# Patient Record
Sex: Female | Born: 1950 | Race: White | Hispanic: No | Marital: Single | Smoking: Current every day smoker
Health system: Southern US, Community
[De-identification: ages and names within clinical notes are randomized; demographics above are authoritative.]

## PROBLEM LIST (undated history)

## (undated) DIAGNOSIS — E119 Type 2 diabetes mellitus without complications: Secondary | ICD-10-CM

## (undated) DIAGNOSIS — F329 Major depressive disorder, single episode, unspecified: Secondary | ICD-10-CM

## (undated) DIAGNOSIS — M797 Fibromyalgia: Secondary | ICD-10-CM

## (undated) DIAGNOSIS — K529 Noninfective gastroenteritis and colitis, unspecified: Secondary | ICD-10-CM

## (undated) DIAGNOSIS — M109 Gout, unspecified: Secondary | ICD-10-CM

## (undated) DIAGNOSIS — I10 Essential (primary) hypertension: Secondary | ICD-10-CM

## (undated) DIAGNOSIS — F32A Depression, unspecified: Secondary | ICD-10-CM

## (undated) HISTORY — PX: ABDOMINAL HYSTERECTOMY: SHX81

## (undated) HISTORY — PX: APPENDECTOMY: SHX54

## (undated) HISTORY — PX: HAND SURGERY: SHX662

## (undated) HISTORY — PX: CHOLECYSTECTOMY: SHX55

## (undated) HISTORY — PX: OTHER SURGICAL HISTORY: SHX169

## (undated) HISTORY — PX: THYROIDECTOMY: SHX17

---

## 2015-05-23 ENCOUNTER — Emergency Department (HOSPITAL_BASED_OUTPATIENT_CLINIC_OR_DEPARTMENT_OTHER): Payer: Medicare Other

## 2015-05-23 ENCOUNTER — Emergency Department (HOSPITAL_BASED_OUTPATIENT_CLINIC_OR_DEPARTMENT_OTHER)
Admission: EM | Admit: 2015-05-23 | Discharge: 2015-05-23 | Disposition: A | Discharge status: Home / Self Care | Payer: Medicare Other | Attending: Emergency Medicine | Admitting: Emergency Medicine

## 2015-05-23 ENCOUNTER — Encounter (HOSPITAL_BASED_OUTPATIENT_CLINIC_OR_DEPARTMENT_OTHER): Payer: Self-pay | Admitting: Emergency Medicine

## 2015-05-23 DIAGNOSIS — Y92003 Bedroom of unspecified non-institutional (private) residence as the place of occurrence of the external cause: Secondary | ICD-10-CM | POA: Diagnosis not present

## 2015-05-23 DIAGNOSIS — S6992XA Unspecified injury of left wrist, hand and finger(s), initial encounter: Secondary | ICD-10-CM | POA: Diagnosis present

## 2015-05-23 DIAGNOSIS — W010XXA Fall on same level from slipping, tripping and stumbling without subsequent striking against object, initial encounter: Secondary | ICD-10-CM | POA: Diagnosis not present

## 2015-05-23 DIAGNOSIS — E119 Type 2 diabetes mellitus without complications: Secondary | ICD-10-CM | POA: Diagnosis not present

## 2015-05-23 DIAGNOSIS — Z8719 Personal history of other diseases of the digestive system: Secondary | ICD-10-CM | POA: Insufficient documentation

## 2015-05-23 DIAGNOSIS — Y9301 Activity, walking, marching and hiking: Secondary | ICD-10-CM | POA: Diagnosis not present

## 2015-05-23 DIAGNOSIS — Z8739 Personal history of other diseases of the musculoskeletal system and connective tissue: Secondary | ICD-10-CM | POA: Insufficient documentation

## 2015-05-23 DIAGNOSIS — S60212A Contusion of left wrist, initial encounter: Secondary | ICD-10-CM | POA: Diagnosis not present

## 2015-05-23 DIAGNOSIS — I10 Essential (primary) hypertension: Secondary | ICD-10-CM | POA: Insufficient documentation

## 2015-05-23 DIAGNOSIS — S52572A Other intraarticular fracture of lower end of left radius, initial encounter for closed fracture: Secondary | ICD-10-CM | POA: Diagnosis not present

## 2015-05-23 DIAGNOSIS — Y998 Other external cause status: Secondary | ICD-10-CM | POA: Insufficient documentation

## 2015-05-23 DIAGNOSIS — Z8659 Personal history of other mental and behavioral disorders: Secondary | ICD-10-CM | POA: Diagnosis not present

## 2015-05-23 DIAGNOSIS — Z720 Tobacco use: Secondary | ICD-10-CM | POA: Diagnosis not present

## 2015-05-23 DIAGNOSIS — S52502A Unspecified fracture of the lower end of left radius, initial encounter for closed fracture: Secondary | ICD-10-CM

## 2015-05-23 DIAGNOSIS — R52 Pain, unspecified: Secondary | ICD-10-CM

## 2015-05-23 HISTORY — DX: Gout, unspecified: M10.9

## 2015-05-23 HISTORY — DX: Depression, unspecified: F32.A

## 2015-05-23 HISTORY — DX: Major depressive disorder, single episode, unspecified: F32.9

## 2015-05-23 HISTORY — DX: Noninfective gastroenteritis and colitis, unspecified: K52.9

## 2015-05-23 HISTORY — DX: Essential (primary) hypertension: I10

## 2015-05-23 HISTORY — DX: Fibromyalgia: M79.7

## 2015-05-23 HISTORY — DX: Type 2 diabetes mellitus without complications: E11.9

## 2015-05-23 MED ORDER — HYDROCODONE-ACETAMINOPHEN 5-325 MG PO TABS
1.0000 | ORAL_TABLET | Freq: Once | ORAL | Status: AC
  Administered 2015-05-23: 1 via ORAL
  Filled 2015-05-23: qty 1

## 2015-05-23 MED ORDER — HYDROCODONE-ACETAMINOPHEN 5-325 MG PO TABS: 1.0000 | ORAL_TABLET | ORAL | Status: AC | PRN

## 2015-05-23 NOTE — ED Provider Notes (Signed)
CSN: [id number]     Arrival date & time 05/23/15  1632 History  This chart was scribed for Glynn Octave, MD by Lyndel Safe, ED Scribe. This patient was seen in room [facility] and the patient's care was started 5:28 PM.  Chief Complaint  Patient presents with  . Wrist Injury   The history is provided by the patient. No language interpreter was used.   HPI Comments: Patricia Stevens is a 64 y.o. female, with a PMhx of DM, HTN, gout, and fibromyalgia, who presents to the Emergency Department complaining of sudden onset, constant, moderate left wrist pain s/p fall that occurred early this morning. Pt reports she tripped, and fell on her left arm while walking around her bedroom approximately at 5am this morning. Pt not taking blood thinning medication. Her pain is exacerbated with ROM. She has asssocaited ecchymosis and swelling to left wrist and forearm. She denies dizziness causing the fall or LOC. The pt was seen by her PCP where she had Xray imaging done; she was placed in a sling and advised to come to ED for further evaluation. Denies neck pain, headache, CP, abdominal pain, any other injuries attributable to the fall, numbness or weakness in left fingers.   Past Medical History  Diagnosis Date  . Hypertension   . Diabetes mellitus without complication   . Fibromyalgia   . Colitis   . Gout   . Depression    Past Surgical History  Procedure Laterality Date  . Appendectomy    . Cholecystectomy    . Abdominal hysterectomy    . Spleen    . Hand surgery    . Thyroidectomy Bilateral    No family history on file. Social History  Substance Use Topics  . Smoking status: Current Every Day Smoker  . Smokeless tobacco: Never Used  . Alcohol Use: No   OB History    No data available     Review of Systems  Cardiovascular: Negative for chest pain.  Gastrointestinal: Negative for abdominal pain.  Musculoskeletal: Positive for joint swelling ( left wrist) and arthralgias ( left  wrist). Negative for neck pain.  Skin: Positive for color change ( ecchymosis to left wrsit).  Neurological: Negative for weakness, numbness and headaches.   A complete 10 system review of systems was obtained and is otherwise negative except at noted in the HPI and PMH.  Allergies  Levaquin and Codeine  Home Medications   Prior to Admission medications   Medication Sig Start Date End Date Taking? Authorizing Provider  HYDROcodone-acetaminophen (NORCO/VICODIN) 5-325 MG per tablet Take 1 tablet by mouth every 4 (four) hours as needed. 05/23/15   Glynn Octave, MD   BP 132/55 mmHg  Pulse 73  Temp(Src) 99.1 F (37.3 C) (Oral)  Ht 5\' 1"  (1.549 m)  Wt 158 lb (71.668 kg)  BMI 29.87 kg/m2  SpO2 95% Physical Exam  Constitutional: She is oriented to person, place, and time. She appears well-developed and well-nourished. No distress.  HENT:  Head: Normocephalic and atraumatic.  Mouth/Throat: Oropharynx is clear and moist. No oropharyngeal exudate.  Eyes: Conjunctivae and EOM are normal. Pupils are equal, round, and reactive to light.  Neck: Normal range of motion. Neck supple.  No meningismus.  Cardiovascular: Normal rate, regular rhythm, normal heart sounds and intact distal pulses.   No murmur heard. Pulmonary/Chest: Effort normal and breath sounds normal. No respiratory distress.  Abdominal: Soft. There is no tenderness. There is no rebound and no guarding.  Musculoskeletal: Normal range of  motion. She exhibits edema and tenderness.  Ecchymosis and deformity to left, palmar wrist; swelling present; radial pulse present; cardinal hand movements intact; FROM of left elbow and shoulder; no C-spine tenderness.   Neurological: She is alert and oriented to person, place, and time. No cranial nerve deficit. She exhibits normal muscle tone. Coordination normal.  No ataxia on finger to nose bilaterally. No pronator drift. 5/5 strength throughout. CN 2-12 intact. Negative Romberg. Equal grip  strength. Sensation intact. Gait is normal.   Skin: Skin is warm.  Psychiatric: She has a normal mood and affect. Her behavior is normal.  Nursing note and vitals reviewed.   ED Course  Procedures  DIAGNOSTIC STUDIES: Oxygen Saturation is 95% on RA, adequate by my interpretation.    COORDINATION OF CARE: 5:35 PM Discussed treatment plan which includes to order Xray imaging of left arm with pt. Pt acknowledges and agrees to plan.   Labs Review Labs Reviewed - No data to display  Imaging Review Dg Elbow Complete Left  05/23/2015   CLINICAL DATA:  64 year old female with fall and left elbow pain.  EXAM: LEFT ELBOW - COMPLETE 3+ VIEW  COMPARISON:  None.  FINDINGS: There is no evidence of fracture, dislocation, or joint effusion. There is no evidence of arthropathy or other focal bone abnormality. Soft tissues are unremarkable.  IMPRESSION: No fracture or dislocation.   Electronically Signed   By: Elgie Collard M.D.   On: 05/23/2015 18:44   Dg Wrist Complete Left  05/23/2015   CLINICAL DATA:  Status post fall.  EXAM: LEFT WRIST - COMPLETE 3+ VIEW  COMPARISON:  None  FINDINGS: There is an acute, comminuted intra-articular impacted fracture deformity involving the distal radius. There is dorsal displacement and dorsal angulation of the distal fracture fragments. The ulnar styloid is not visualized and may be fractured as well.  IMPRESSION: 1. Distal radius fracture with dorsal angulation and displacement.   Electronically Signed   By: Signa Kell M.D.   On: 05/23/2015 18:46   I have personally reviewed and evaluated these images and lab results as part of my medical decision-making.   EKG Interpretation None      MDM   Final diagnoses:  Distal radius fracture, left, closed, initial encounter   Fall on outstretched left arm sometime early this morning. Did not hit head or lose consciousness. Seen at PCP and had x-ray that showed radius fracture.  Pulse intact. There is edema and  ecchymosis to the left wrist.  X-rays reviewed with Dr. Izora Ribas who reviewed the images. He thinks this will need surgical repair. Does not recommend any attempt at reduction. Recommend splint and follow-up with office this week.  Sugar tong splint, sling, pain control, elevation, follow up with hand surgery. Return precautions discussed.  I personally performed the services described in this documentation, which was scribed in my presence. The recorded information has been reviewed and is accurate.    Glynn Octave, MD 05/23/15 [phone]

## 2015-05-23 NOTE — ED Notes (Signed)
Pt received IM shot of toradol (dose unknown) at 215pm today.

## 2015-05-23 NOTE — Discharge Instructions (Signed)
Radial Fracture Followup with Dr. Izora Ribas on Monday.  Keep your arm elevated.  Return to the ED if you develop new or worsening symptoms. You have a broken bone (fracture) of the forearm. This is the part of your arm between the elbow and your wrist. Your forearm is made up of two bones. These are the radius and ulna. Your fracture is in the radial shaft. This is the bone in your forearm located on the thumb side. A cast or splint is used to protect and keep your injured bone from moving. The cast or splint will be on generally for about 5 to 6 weeks, with individual variations. HOME CARE INSTRUCTIONS   Keep the injured part elevated while sitting or lying down. Keep the injury above the level of your heart (the center of the chest). This will decrease swelling and pain.  Apply ice to the injury for 15-20 minutes, 03-04 times per day while awake, for 2 days. Put the ice in a plastic bag and place a towel between the bag of ice and your cast or splint.  Move your fingers to avoid stiffness and minimize swelling.  If you have a plaster or fiberglass cast:  Do not try to scratch the skin under the cast using sharp or pointed objects.  Check the skin around the cast every day. You may put lotion on any red or sore areas.  Keep your cast dry and clean.  If you have a plaster splint:  Wear the splint as directed.  You may loosen the elastic around the splint if your fingers become numb, tingle, or turn cold or blue.  Do not put pressure on any part of your cast or splint. It may break. Rest your cast only on a pillow for the first 24 hours until it is fully hardened.  Your cast or splint can be protected during bathing with a plastic bag. Do not lower the cast or splint into water.  Only take over-the-counter or prescription medicines for pain, discomfort, or fever as directed by your caregiver. SEEK IMMEDIATE MEDICAL CARE IF:   Your cast gets damaged or breaks.  You have more severe pain  or swelling than you did before getting the cast.  You have severe pain when stretching your fingers.  There is a bad smell, new stains and/or pus-like (purulent) drainage coming from under the cast.  Your fingers or hand turn pale or blue and become cold or your loose feeling. Document Released: 02/09/2006 Document Revised: 11/21/2011 Document Reviewed: 05/08/2006 [facility] Patient Information 2015 [place], [place]. This information is not intended to replace advice given to you by your health care provider. Make sure you discuss any questions you have with your health care provider.

## 2015-05-23 NOTE — ED Notes (Signed)
Pt seen by PMD today for wrist pain post fall.  Imaging results show distal radial fracture.  Pt advised to come to ED for treatment.

## 2017-02-21 IMAGING — CR DG ELBOW COMPLETE 3+V*L*
4 series · 4 of 4 positions shown · non-contrast
Comparison: None.

CLINICAL DATA: 64-year-old female with fall and left elbow pain.

EXAM:
LEFT ELBOW - COMPLETE 3+ VIEW

[x elbow joint lat left]
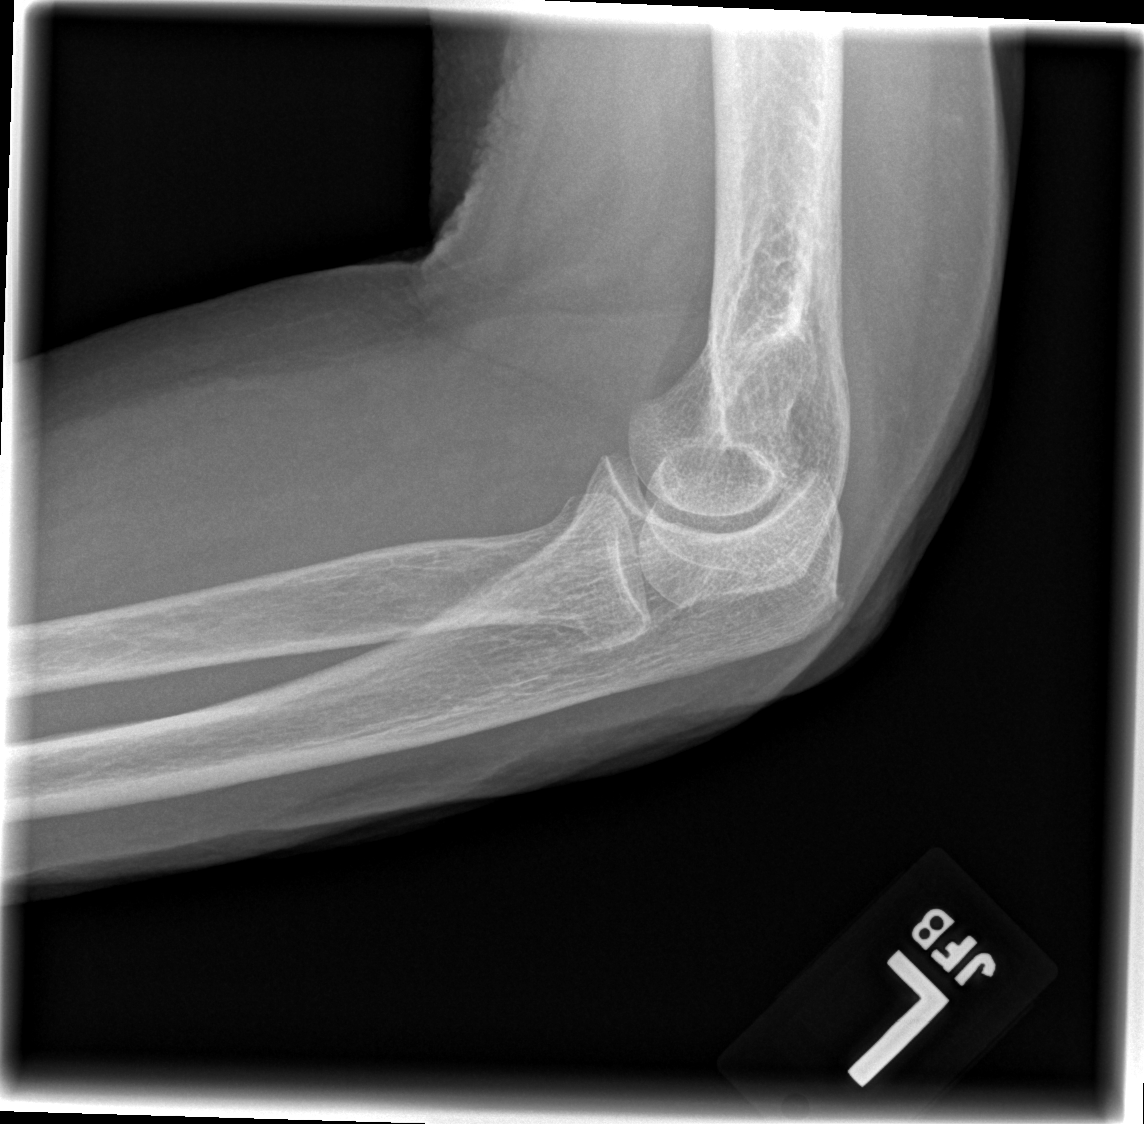

[x elbow joint obl. left (1 of 2)]
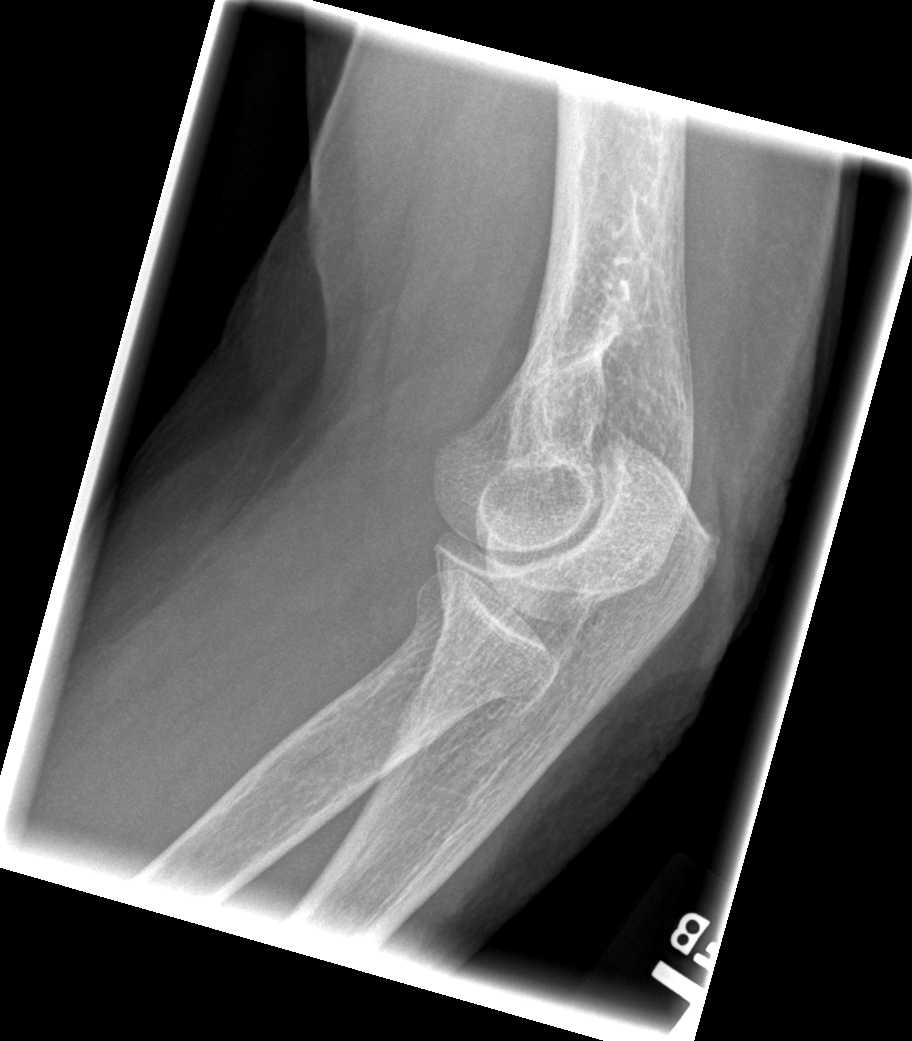

[x elbow joint ap left]
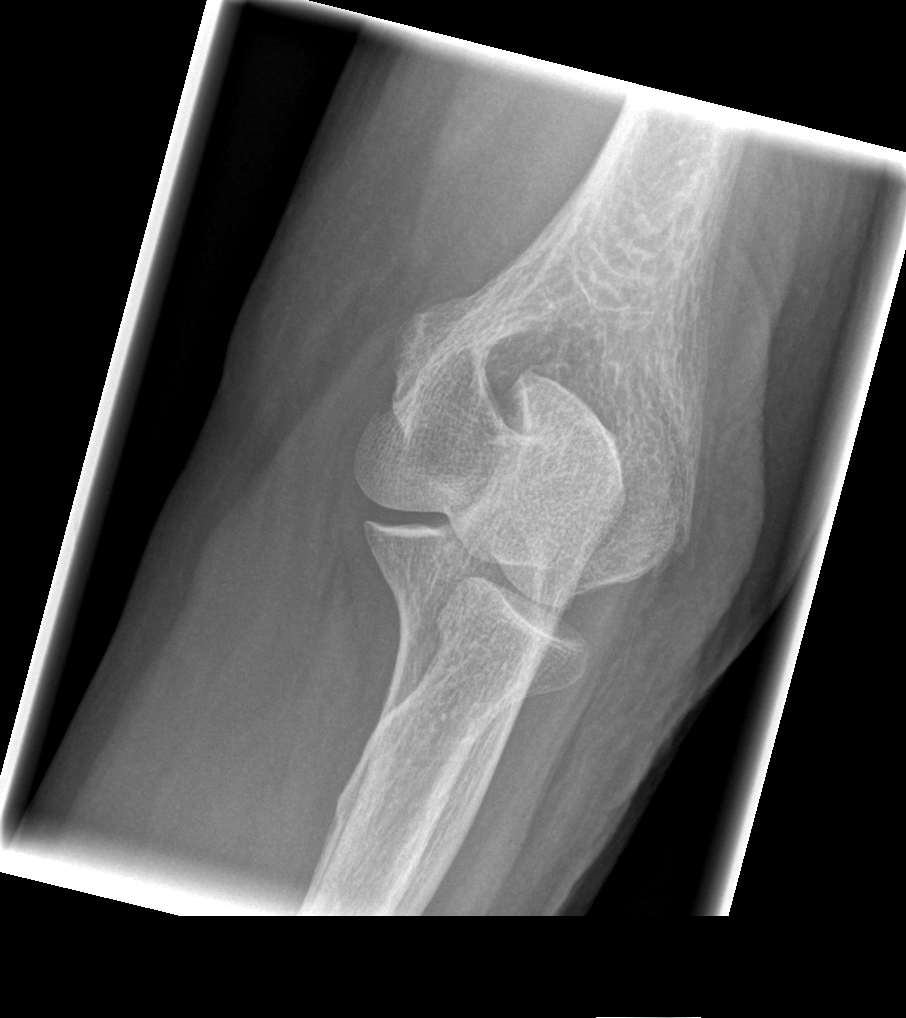

[x elbow joint obl. left (2 of 2)]
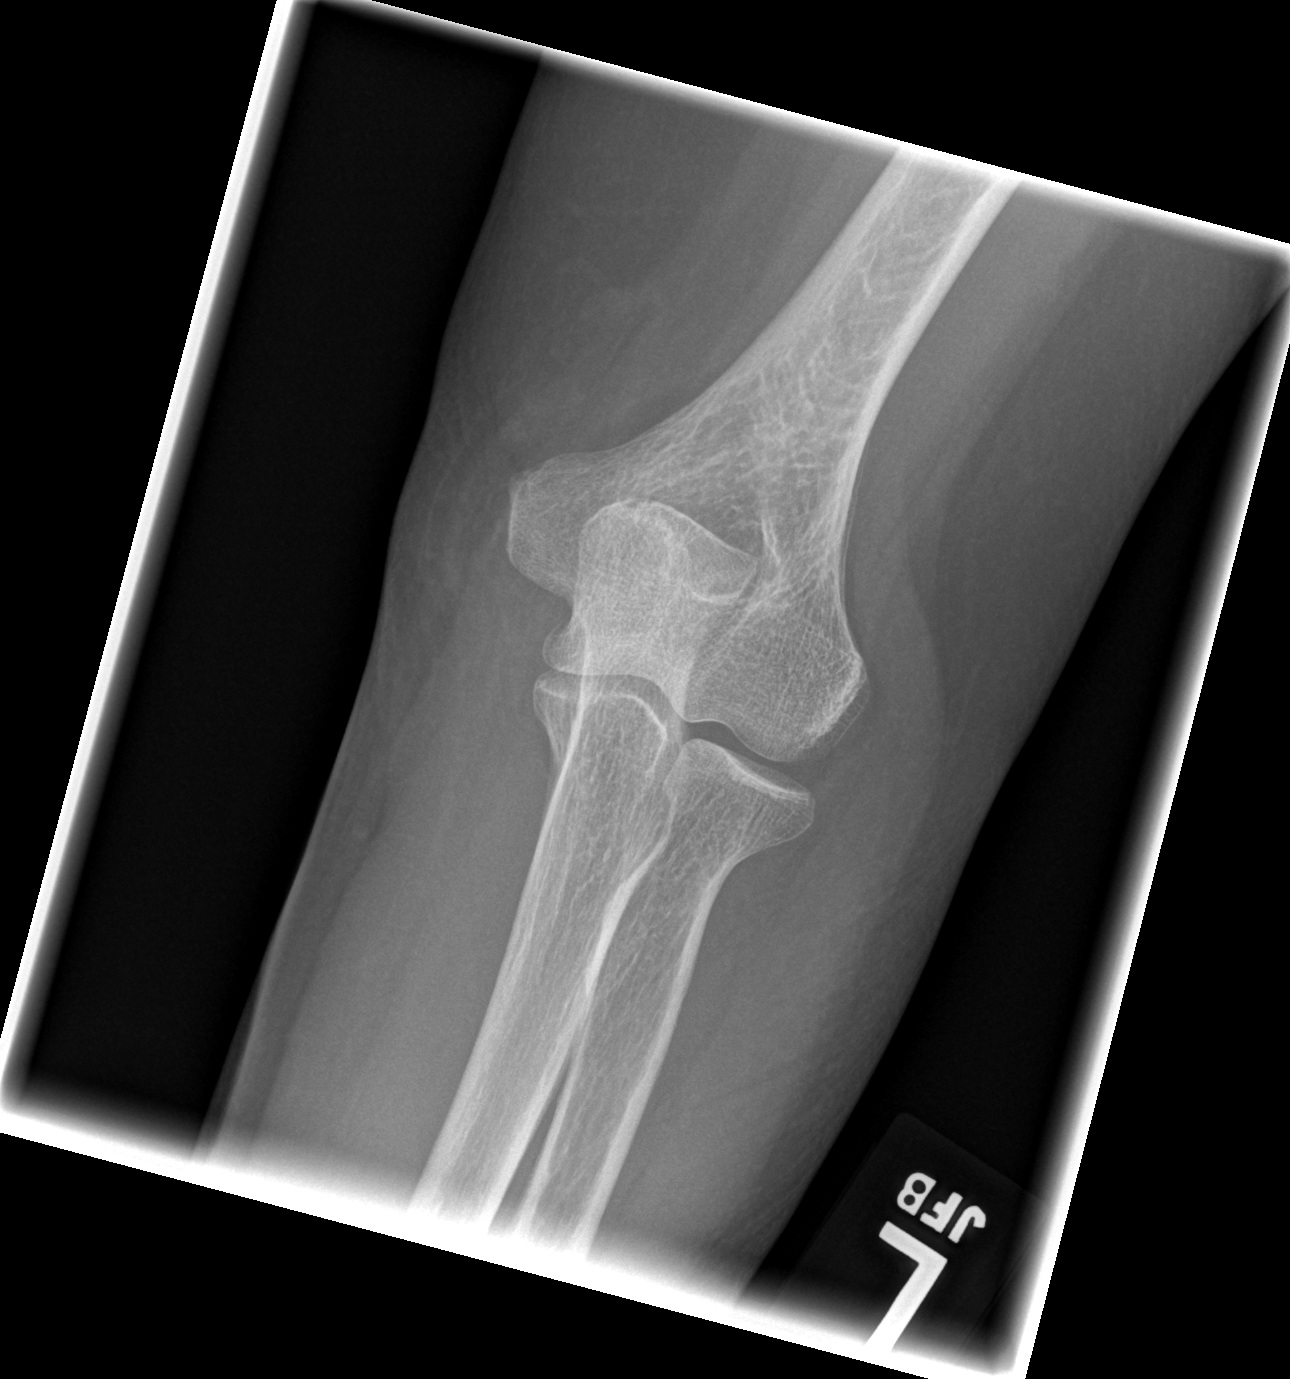

[4 of 4 positions shown; findings below may reference images not displayed]

FINDINGS: There is no evidence of fracture, dislocation, or joint effusion.
There is no evidence of arthropathy or other focal bone abnormality.
Soft tissues are unremarkable.
IMPRESSION: No fracture or dislocation.

## 2020-11-10 DEATH — deceased
